# Patient Record
Sex: Male | Born: 1965 | Race: Black or African American | Hispanic: No | Marital: Single | Smoking: Former smoker
Health system: Southern US, Community
[De-identification: ages and names within clinical notes are randomized; demographics above are authoritative.]

## PROBLEM LIST (undated history)

## (undated) DIAGNOSIS — K5010 Crohn's disease of large intestine without complications: Secondary | ICD-10-CM

## (undated) HISTORY — PX: COLON SURGERY: SHX602

---

## 2014-11-30 ENCOUNTER — Ambulatory Visit
Admission: EM | Admit: 2014-11-30 | Discharge: 2014-11-30 | Disposition: A | Discharge status: Home / Self Care | Payer: BLUE CROSS/BLUE SHIELD | Attending: Family Medicine | Admitting: Family Medicine

## 2014-11-30 ENCOUNTER — Ambulatory Visit: Payer: BLUE CROSS/BLUE SHIELD

## 2014-11-30 ENCOUNTER — Encounter: Payer: Self-pay | Admitting: Emergency Medicine

## 2014-11-30 DIAGNOSIS — J189 Pneumonia, unspecified organism: Secondary | ICD-10-CM | POA: Diagnosis not present

## 2014-11-30 DIAGNOSIS — J181 Lobar pneumonia, unspecified organism: Principal | ICD-10-CM

## 2014-11-30 LAB — COMPREHENSIVE METABOLIC PANEL
ALBUMIN: 3.5 g/dL (ref 3.5–5.0)
ALT: 63 U/L (ref 17–63)
AST: 37 U/L (ref 15–41)
Alkaline Phosphatase: 86 U/L (ref 38–126)
Anion gap: 12 (ref 5–15)
BILIRUBIN TOTAL: 0.8 mg/dL (ref 0.3–1.2)
BUN: 16 mg/dL (ref 6–20)
CO2: 24 mmol/L (ref 22–32)
CREATININE: 1.29 mg/dL — AB (ref 0.61–1.24)
Calcium: 8.5 mg/dL — ABNORMAL LOW (ref 8.9–10.3)
Chloride: 98 mmol/L — ABNORMAL LOW (ref 101–111)
GFR calc Af Amer: >60 mL/min (ref >60)
GLUCOSE: 110 mg/dL — AB (ref 65–99)
Potassium: 3.8 mmol/L (ref 3.5–5.1)
Sodium: 134 mmol/L — ABNORMAL LOW (ref 135–145)
TOTAL PROTEIN: 7.7 g/dL (ref 6.5–8.1)

## 2014-11-30 LAB — CBC WITH DIFFERENTIAL/PLATELET
BASOS ABS: 0.1 10*3/uL (ref 0–0.1)
Basophils Relative: 0 %
Eosinophils Absolute: 0 10*3/uL (ref 0–0.7)
Eosinophils Relative: 0 %
HEMATOCRIT: 39.8 % — AB (ref 40.0–52.0)
HEMOGLOBIN: 13.7 g/dL (ref 13.0–18.0)
LYMPHS PCT: 15 %
Lymphs Abs: 2.1 10*3/uL (ref 1.0–3.6)
MCH: 29.7 pg (ref 26.0–34.0)
MCHC: 34.5 g/dL (ref 32.0–36.0)
MCV: 86.3 fL (ref 80.0–100.0)
Monocytes Absolute: 1.7 10*3/uL — ABNORMAL HIGH (ref 0.2–1.0)
Monocytes Relative: 12 %
NEUTROS ABS: 10.3 10*3/uL — AB (ref 1.4–6.5)
NEUTROS PCT: 73 %
Platelets: 219 10*3/uL (ref 150–440)
RBC: 4.62 MIL/uL (ref 4.40–5.90)
RDW: 12.9 % (ref 11.5–14.5)
WBC: 14.1 10*3/uL — AB (ref 3.8–10.6)

## 2014-11-30 MED ORDER — LEVOFLOXACIN 750 MG PO TABS: 750.0000 mg | ORAL_TABLET | Freq: Every day | ORAL | Status: DC

## 2014-11-30 MED ORDER — ACETAMINOPHEN 500 MG PO TABS
1000.0000 mg | ORAL_TABLET | Freq: Once | ORAL | Status: AC
  Administered 2014-11-30: 1000 mg via ORAL

## 2014-11-30 MED ORDER — LIDOCAINE HCL (PF) 1 % IJ SOLN
2.0000 mL | Freq: Once | INTRAMUSCULAR | Status: AC
  Administered 2014-11-30: 2 mL

## 2014-11-30 MED ORDER — IBUPROFEN 100 MG/5ML PO SUSP: 800.0000 mg | Freq: Once | ORAL | Status: DC

## 2014-11-30 MED ORDER — CEFTRIAXONE SODIUM 1 G IJ SOLR
1.0000 g | Freq: Once | INTRAMUSCULAR | Status: AC
  Administered 2014-11-30: 1 g via INTRAMUSCULAR

## 2014-11-30 MED ORDER — IBUPROFEN 800 MG PO TABS
800.0000 mg | ORAL_TABLET | Freq: Once | ORAL | Status: AC
  Administered 2014-11-30: 800 mg via ORAL

## 2014-11-30 NOTE — ED Notes (Signed)
Patient chest pain that started Saturday.  Patient denies SOB.

## 2014-11-30 NOTE — ED Provider Notes (Signed)
CSN: [id number]     Arrival date & time 11/30/14  1508 History   First MD Initiated Contact with Patient 11/30/14 1547     Chief Complaint  Patient presents with  . Chest Pain   (Consider location/radiation/quality/duration/timing/severity/associated sxs/prior Treatment) HPI Comments: 49 yo male with a 3 days h/o chest pains, associated with chills and fevers.  States pain is worse when laying on the left side. Denies any shortness of breath. Has a h/o Crohn's disease and receives Remicade infusions about every 8 weeks; last one was about 1 month ago.   The history is provided by the patient.    History reviewed. No pertinent past medical history. Past Surgical History  Procedure Laterality Date  . Colon surgery     History reviewed. No pertinent family history. Social History  Substance Use Topics  . Smoking status: Former Games developer  . Smokeless tobacco: None  . Alcohol Use: No    Review of Systems  Allergies  Review of patient's allergies indicates no known allergies.  Home Medications   Prior to Admission medications   Medication Sig Start Date End Date Taking? Authorizing Provider  inFLIXimab (REMICADE) 100 MG injection Inject 1 mg into the vein every 8 (eight) weeks.   Yes Historical Provider, MD  levofloxacin (LEVAQUIN) 750 MG tablet Take 1 tablet (750 mg total) by mouth daily. 11/30/14   Payton Mccallum, MD   Meds Ordered and Administered this Visit   Medications  acetaminophen (TYLENOL) tablet 1,000 mg (1,000 mg Oral Given 11/30/14 1700)  ibuprofen (ADVIL,MOTRIN) tablet 800 mg (800 mg Oral Given 11/30/14 1530)  cefTRIAXone (ROCEPHIN) injection 1 g (1 g Intramuscular Given 11/30/14 1718)  lidocaine (PF) (XYLOCAINE) 1 % injection 2 mL (2 mLs Other Given 11/30/14 1718)    BP 117/68 mmHg  Pulse 101  Temp(Src) 99.9 F (37.7 C) (Oral)  Resp 17  Ht  (1.803 m)  Wt 187 lb (84.823 kg)  BMI 26.09 kg/m2  SpO2 100% No data found.   Physical Exam  Constitutional: He  appears well-developed and well-nourished. No distress.  HENT:  Head: Normocephalic and atraumatic.  Right Ear: Tympanic membrane, external ear and ear canal normal.  Left Ear: Tympanic membrane, external ear and ear canal normal.  Nose: Nose normal.  Mouth/Throat: Uvula is midline, oropharynx is clear and moist and mucous membranes are normal. No oropharyngeal exudate or tonsillar abscesses.  Eyes: Conjunctivae and EOM are normal. Pupils are equal, round, and reactive to light. Right eye exhibits no discharge. Left eye exhibits no discharge. No scleral icterus.  Neck: Normal range of motion. Neck supple. No tracheal deviation present. No thyromegaly present.  Cardiovascular: Normal rate, regular rhythm and normal heart sounds.   Pulmonary/Chest: Effort normal and breath sounds normal. No stridor. No respiratory distress. He has no wheezes. He has no rales. He exhibits no tenderness.  Lymphadenopathy:    He has no cervical adenopathy.  Neurological: He is alert.  Skin: Skin is warm and dry. No rash noted. He is not diaphoretic.  Nursing note and vitals reviewed.   ED Course  Procedures (including critical care time)  Labs Review Labs Reviewed  CBC WITH DIFFERENTIAL/PLATELET - Abnormal; Notable for the following:    WBC 14.1 (*)    HCT 39.8 (*)    Neutro Abs 10.3 (*)    Monocytes Absolute 1.7 (*)    All other components within normal limits  COMPREHENSIVE METABOLIC PANEL - Abnormal; Notable for the following:    Sodium 134 (*)  Chloride 98 (*)    Glucose, Bld 110 (*)    Creatinine, Ser 1.29 (*)    Calcium 8.5 (*)    All other components within normal limits    Imaging Review Dg Chest 2 View  11/30/2014   CLINICAL DATA:  49 year old male with fever, left chest pain and body aches for 3 days.  EXAM: CHEST  2 VIEW  COMPARISON:  None.  FINDINGS: Left perihilar/superior segment left lower lobe airspace opacity is compatible with pneumonia.  The cardiomediastinal silhouette is  otherwise unremarkable.  There is no evidence of pulmonary edema, suspicious pulmonary nodule/mass, pleural effusion, or pneumothorax.  No acute bony abnormalities are identified.  IMPRESSION: Left perihilar/ superior segment left lower lobe airspace opacity - likely representing pneumonia given history. Radiographic follow-up to resolution is recommended.   Electronically Signed   By: Harmon Pier M.D.   On: 11/30/2014 16:30     Visual Acuity Review  Right Eye Distance:   Left Eye Distance:   Bilateral Distance:    Right Eye Near:   Left Eye Near:    Bilateral Near:         MDM   1. Left lower lobe pneumonia    Discharge Medication List as of 11/30/2014  5:15 PM    START taking these medications   Details  levofloxacin (LEVAQUIN) 750 MG tablet Take 1 tablet (750 mg total) by mouth daily., Starting 11/30/2014, Until Discontinued, Normal      Plan: 1. Test/x-ray results and diagnosis reviewed with patient 2. rx as per orders; risks, benefits, potential side effects reviewed with patient 3. Patient was given Rocephin 1gm IM x1 in clinic, as well as ibuprofen and tylenol po as per orders  4. Recommend supportive treatment with increased fluids, otc analgesics 5. Follow up with PCP in 3-4 days   6.  F/u here or ED prn if symptoms worsen or don't improve   Payton Mccallum, MD 11/30/14 1858

## 2014-11-30 NOTE — Discharge Instructions (Signed)

## 2017-03-15 ENCOUNTER — Encounter: Payer: Self-pay | Admitting: *Deleted

## 2017-03-15 ENCOUNTER — Ambulatory Visit
Admission: EM | Admit: 2017-03-15 | Discharge: 2017-03-15 | Disposition: A | Discharge status: Home / Self Care | Payer: BLUE CROSS/BLUE SHIELD | Attending: Family Medicine | Admitting: Family Medicine

## 2017-03-15 DIAGNOSIS — K047 Periapical abscess without sinus: Secondary | ICD-10-CM

## 2017-03-15 HISTORY — DX: Crohn's disease of large intestine without complications: K50.10

## 2017-03-15 MED ORDER — AMOXICILLIN-POT CLAVULANATE 875-125 MG PO TABS: 1.0000 | ORAL_TABLET | Freq: Two times a day (BID) | ORAL | 0 refills | Status: DC

## 2017-03-15 NOTE — ED Triage Notes (Signed)
Awoke Tuesday with right sided facial edema. Contacted his dentist who rx Amoxicillin and Ibuprofen. Edema is worse today.

## 2017-03-15 NOTE — ED Provider Notes (Addendum)
[facility]    CSN: [id number] Arrival date & time: 03/15/17  1051     History   Chief Complaint Chief Complaint  Patient presents with  . Facial Swelling    HPI Mark Woods is a 51 y.o. male.   HPI  Is a 51 year old male that states he awoke on Tuesday 2 days prior to his visit with right sided facial edema.  He has had pain over a area on his upper palate behind the incisors on the right.  This morning he awoke and his swelling was much worse and his eye was actually closed due to swelling.  Has gotten much better since he has been up for a while.  He contacted his dentist who called in a prescription for amoxicillin and ibuprofen.  He is only taken 1 dose of the amoxicillin.  He will follow up with his dentist next week.          Past Medical History:  Diagnosis Date  . Crohn's colitis (HCC)     There are no active problems to display for this patient.   Past Surgical History:  Procedure Laterality Date  . COLON SURGERY         Home Medications    Prior to Admission medications   Medication Sig Start Date End Date Taking? Authorizing Provider  inFLIXimab (REMICADE) 100 MG injection Inject 1 mg into the vein every 8 (eight) weeks.   Yes [provider]  amoxicillin-clavulanate (AUGMENTIN) 875-125 MG tablet Take 1 tablet by mouth every 12 (twelve) hours. 03/15/17   Lutricia Feiloemer, Taiden Raybourn P, PA-C    Family History Family History  Problem Relation Age of Onset  . Healthy Mother   . Healthy Father     Social History Social History   Tobacco Use  . Smoking status: Former Games developermoker  . Smokeless tobacco: Never Used  Substance Use Topics  . Alcohol use: No  . Drug use: No     Allergies   Patient has no known allergies.   Review of Systems Review of Systems  Constitutional: Positive for activity change. Negative for chills, fatigue and fever.  HENT: Positive for dental problem and facial swelling.   All other systems reviewed and  are negative.    Physical Exam Triage Vital Signs ED Triage Vitals  Enc Vitals Group     BP 03/15/17 1111 (!) 147/78     Pulse Rate 03/15/17 1111 67     Resp 03/15/17 1111 16     Temp 03/15/17 1111 98.8 F (37.1 C)     Temp Source 03/15/17 1111 Oral     SpO2 03/15/17 1111 100 %     Weight 03/15/17 1113 186 lb (84.4 kg)     Height 03/15/17 1113 5\' 11"  (1.803 m)     Head Circumference --      Peak Flow --      Pain Score --      Pain Loc --      Pain Edu? --      Excl. in GC? --    No data found.  Updated Vital Signs BP (!) 147/78 (BP Location: Left Arm)   Pulse 67   Temp 98.8 F (37.1 C) (Oral)   Resp 16   Ht 5\' 11"  (1.803 m)   Wt 186 lb (84.4 kg)   SpO2 100%   BMI 25.94 kg/m   Visual Acuity Right Eye Distance:   Left Eye Distance:   Bilateral Distance:  Right Eye Near:   Left Eye Near:    Bilateral Near:     Physical Exam  Constitutional: He is oriented to person, place, and time. He appears well-developed and well-nourished. No distress.  HENT:  Head: Normocephalic.  Examination shows the patient's right side of his face to be swollen in comparison to the left.  End of the oropharynx that shows a moderately enlarged swelling on the upper palate just behind the incisor.  Referred to photographs for detail.  Not appear to be any apical lymphadenopathy although there is a fullness over the right cheek at the mandibular border.  The swelling on the hard palate is firm but not fluctuant.  It is tender to palpation  Eyes: EOM are normal. Pupils are equal, round, and reactive to light.  Neck: Normal range of motion.  Musculoskeletal: Normal range of motion.  Lymphadenopathy:    He has no cervical adenopathy.  Neurological: He is alert and oriented to person, place, and time.  Skin: Skin is warm and dry. He is not diaphoretic.  Psychiatric: He has a normal mood and affect. His behavior is normal. Judgment and thought content normal.  Nursing note and vitals  reviewed.        UC Treatments / Results  Labs (all labs ordered are listed, but only abnormal results are displayed) Labs Reviewed - No data to display  EKG  EKG Interpretation None       Radiology No results found.  Procedures Procedures (including critical care time)  Medications Ordered in UC Medications - No data to display   Initial Impression / Assessment and Plan / UC Course  I have reviewed the triage vital signs and the nursing notes.  Pertinent labs & imaging results that were available during my care of the patient were reviewed by me and considered in my medical decision making (see chart for details).     Plan: 1. Test/x-ray results and diagnosis reviewed with patient 2. rx as per orders; risks, benefits, potential side effects reviewed with patient 3. Recommend supportive treatment with ice packs to the right face for swelling and discomfort.  Continue with the ibuprofen.  For better coverage of switched him from amoxicillin to Augmentin.  Recommend he follow-up with his dentist next week. 4. F/u prn if symptoms worsen or don't improve   Final Clinical Impressions(s) / UC Diagnoses   Final diagnoses:  Dental abscess    ED Discharge Orders        Ordered    amoxicillin-clavulanate (AUGMENTIN) 875-125 MG tablet  Every 12 hours     03/15/17 1210       Controlled Substance Prescriptions [place] Controlled Substance Registry consulted? Not Applicable   Lutricia FeilRoemer, Zhania Shaheen P, PA-C 03/15/17 1224    Lutricia Feiloemer, Dominic Mahaney P, PA-C 03/15/17 1226

## 2017-03-15 NOTE — Discharge Instructions (Signed)
Follow-up with your dentist next week.  Apply ice to your face 20 minutes out of every 2 hours 4-5 times daily for swelling and pain

## 2017-04-16 IMAGING — CR DG CHEST 2V
2 series · 3 of 3 positions shown · non-contrast
Comparison: None.

CLINICAL DATA: 48-year-old male with fever, left chest pain and
body aches for 3 days.

EXAM:
CHEST  2 VIEW

[chest pa]
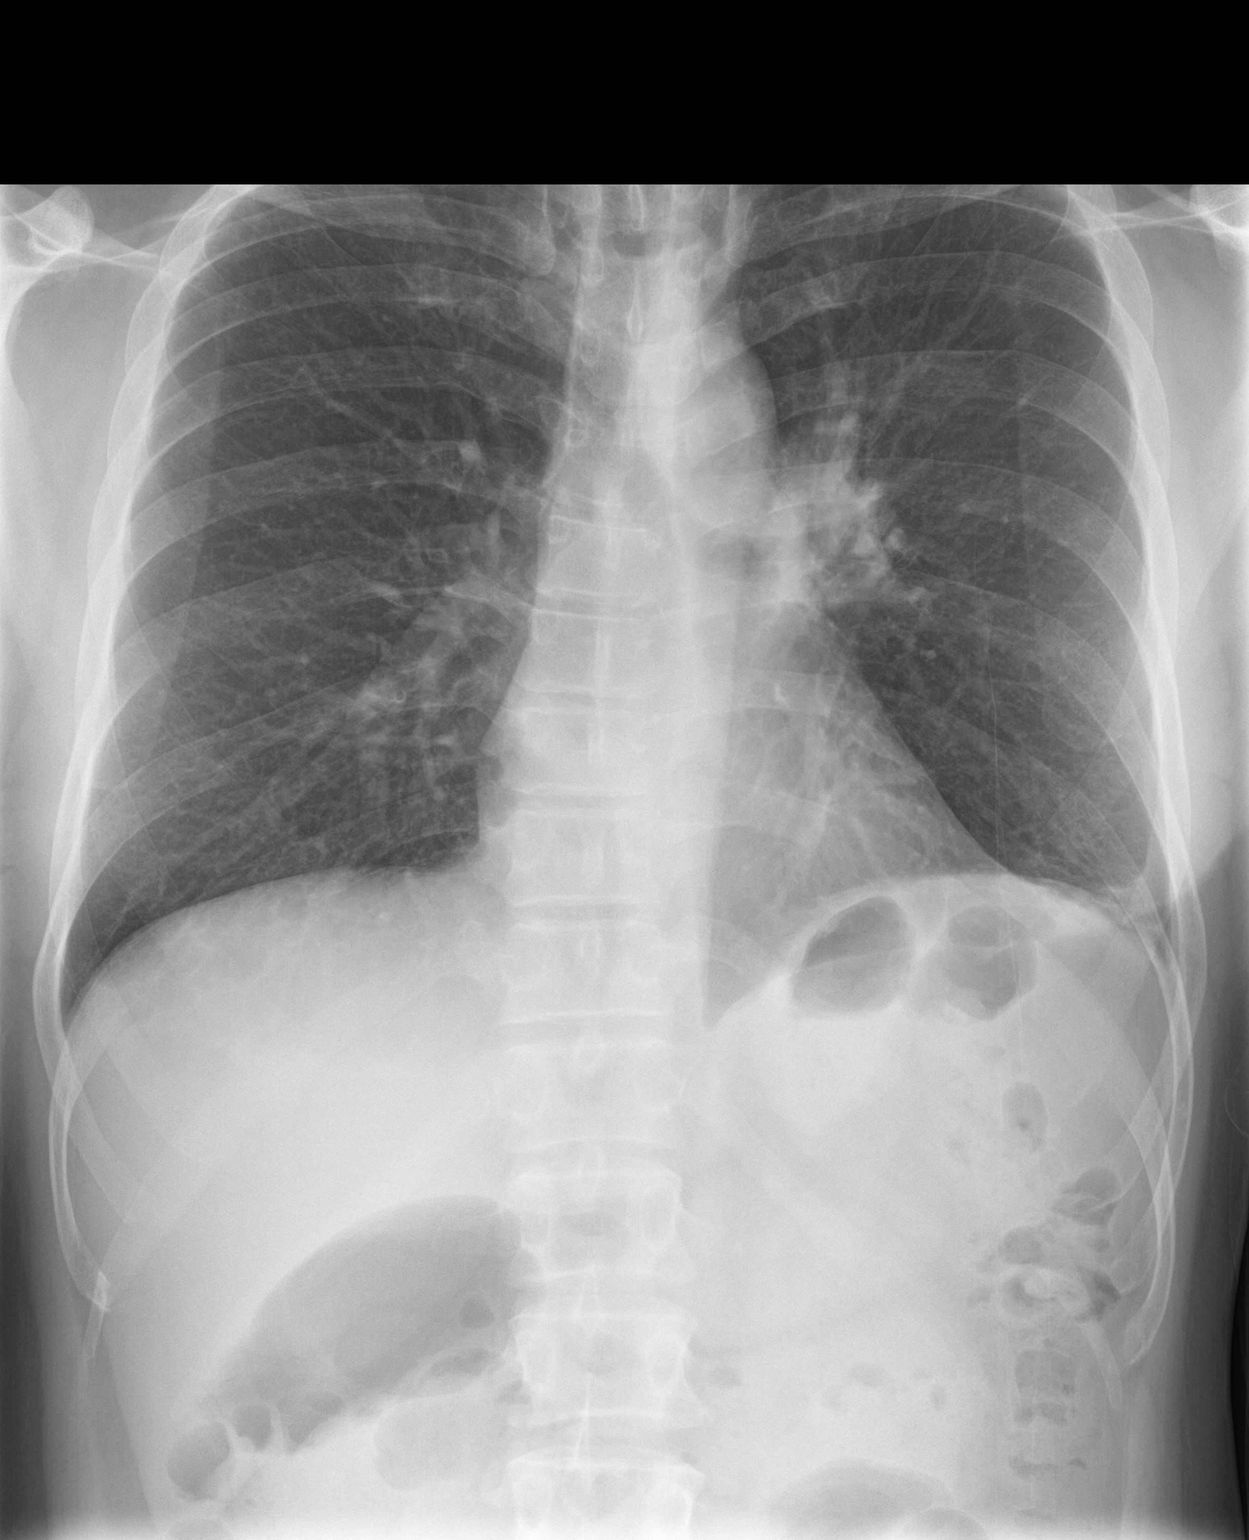

[Series 2: chest lat · 0.14mm/px · 2 of 2 slices shown]
[im 1/2]
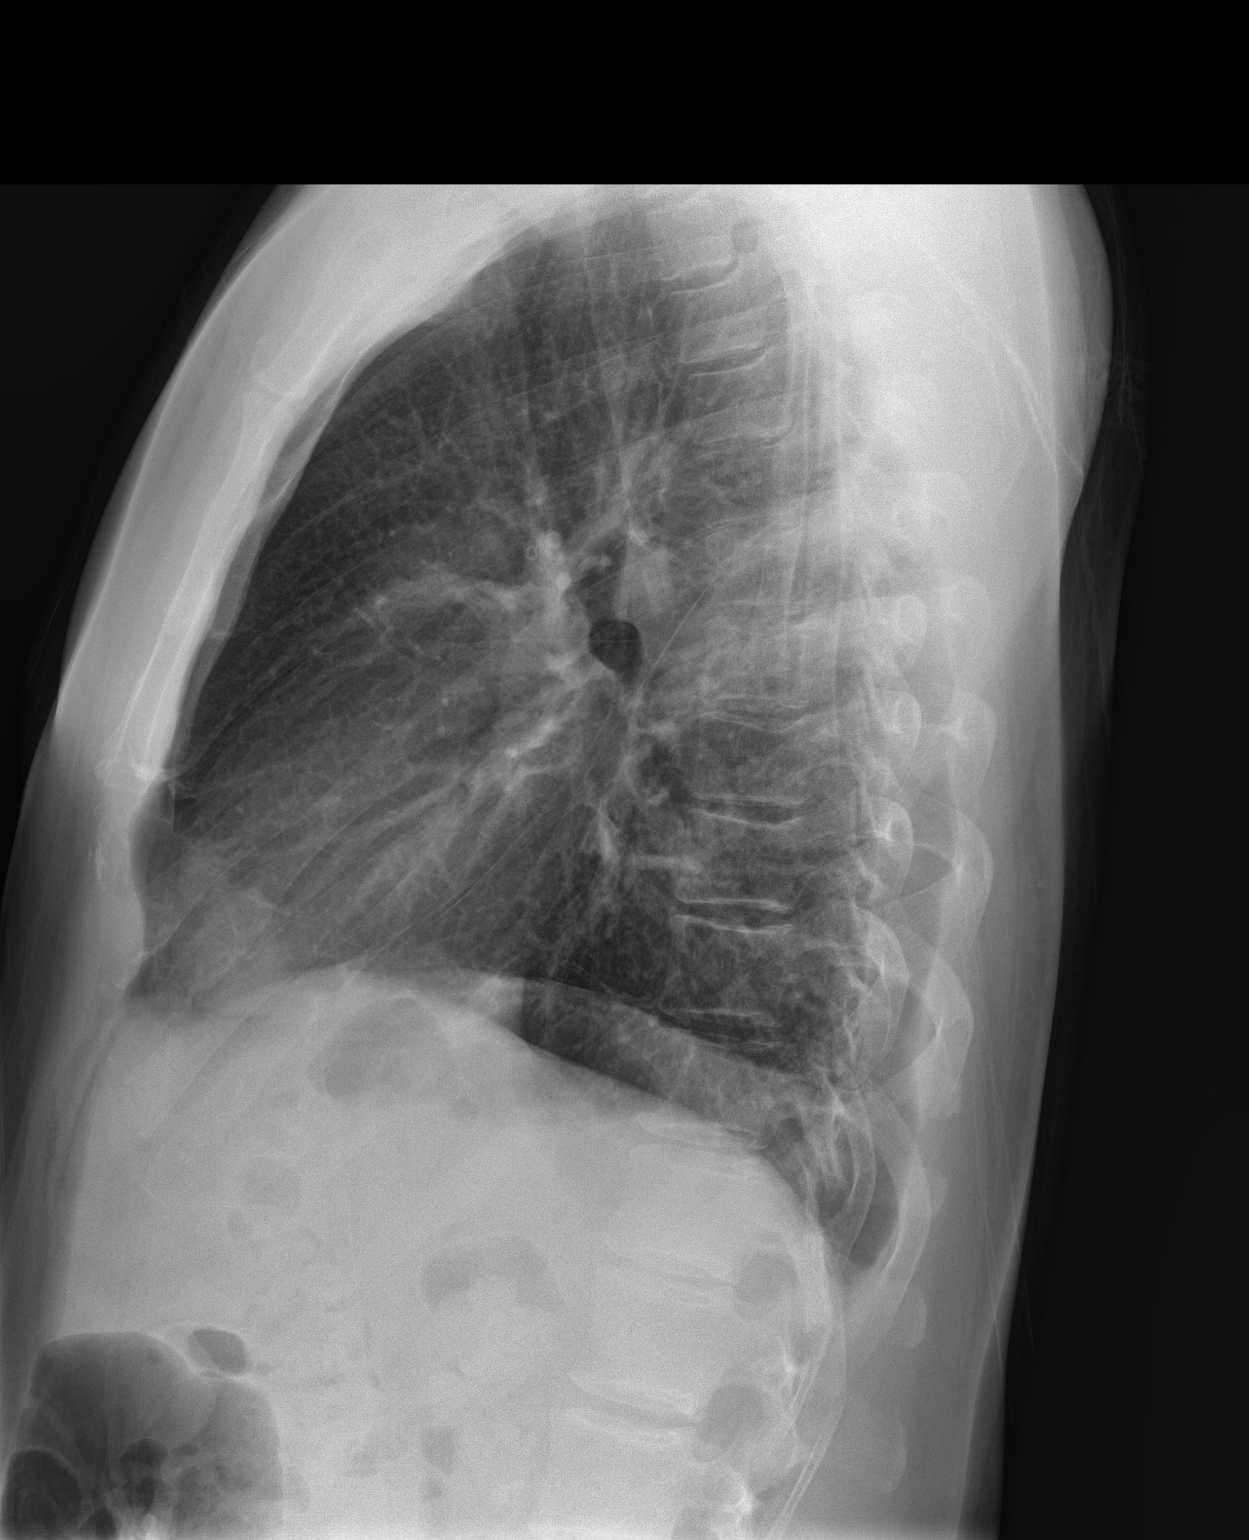
[im 2/2]
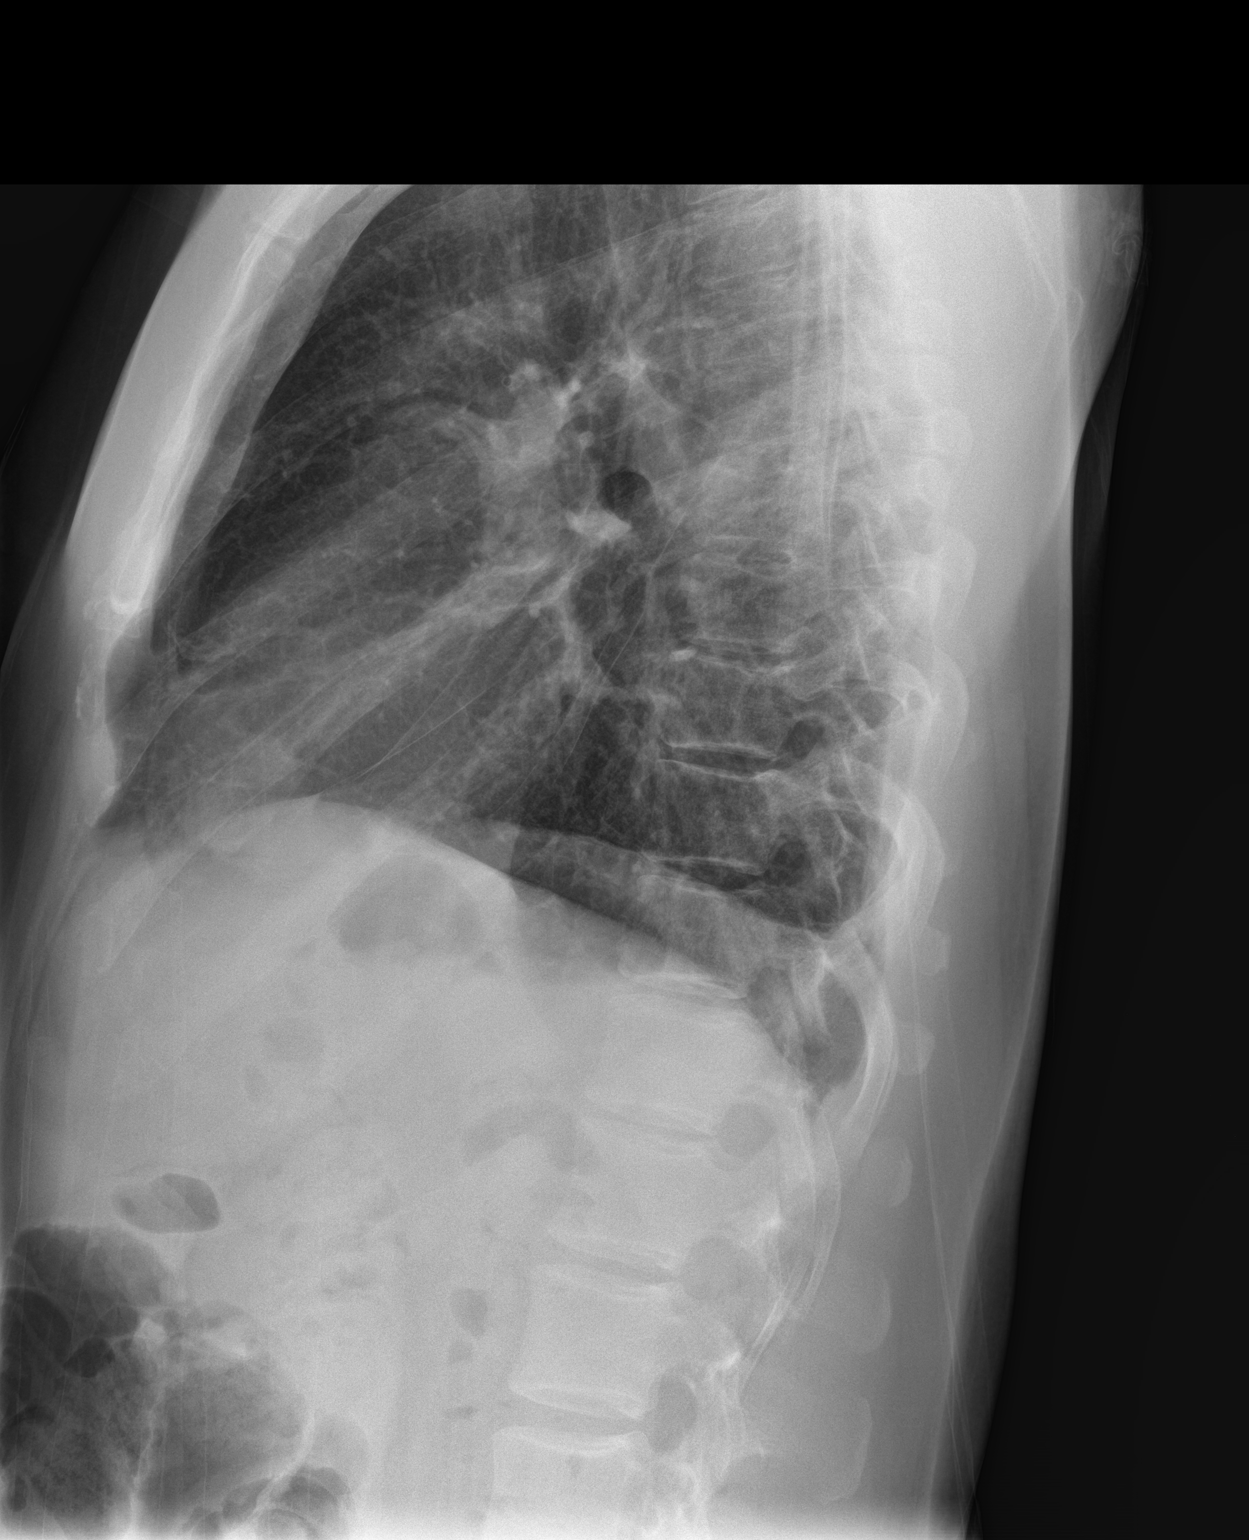

[3 of 3 positions shown; findings below may reference images not displayed]

FINDINGS: Left perihilar/superior segment left lower lobe airspace opacity is
compatible with pneumonia.

The cardiomediastinal silhouette is otherwise unremarkable.

There is no evidence of pulmonary edema, suspicious pulmonary
nodule/mass, pleural effusion, or pneumothorax.

No acute bony abnormalities are identified.
IMPRESSION: Left perihilar/ superior segment left lower lobe airspace opacity -
likely representing pneumonia given history. Radiographic follow-up
to resolution is recommended.

## 2017-10-04 ENCOUNTER — Ambulatory Visit
Admission: EM | Admit: 2017-10-04 | Discharge: 2017-10-04 | Disposition: A | Discharge status: Home / Self Care | Payer: Managed Care, Other (non HMO) | Attending: Family Medicine | Admitting: Family Medicine

## 2017-10-04 ENCOUNTER — Other Ambulatory Visit: Payer: Self-pay

## 2017-10-04 DIAGNOSIS — B029 Zoster without complications: Secondary | ICD-10-CM | POA: Diagnosis not present

## 2017-10-04 MED ORDER — VALACYCLOVIR HCL 1 G PO TABS: 1000.0000 mg | ORAL_TABLET | Freq: Three times a day (TID) | ORAL | 0 refills | Status: AC

## 2017-10-04 MED ORDER — TRAMADOL HCL 50 MG PO TABS: 50.0000 mg | ORAL_TABLET | Freq: Three times a day (TID) | ORAL | 0 refills | Status: AC | PRN

## 2017-10-04 MED ORDER — NAPROXEN 500 MG PO TABS: 500.0000 mg | ORAL_TABLET | Freq: Two times a day (BID) | ORAL | 0 refills | Status: AC | PRN

## 2017-10-04 NOTE — Discharge Instructions (Signed)
Medications as directed.  Take care  Dr. Florida Nolton  

## 2017-10-04 NOTE — ED Provider Notes (Signed)
[facility]   CSN: [id number] Arrival date & time: 10/04/17  1001   History   Chief Complaint Chief Complaint  Patient presents with  . Rash   HPI  52 year old male presents with rash.  Patient reports that he developed a rash on Monday.  Has worsened.  Rash is located on the left chest below the nipple and extends posteriorly to the back.  Associated pain.  Rash is vesicular.  No medications or interventions tried.  Moderate in severity.  Exacerbated by activity.  No relieving factors.  No other associated symptoms.  No other complaints.  Past Medical History:  Diagnosis Date  . Crohn's colitis [facility])    Past Surgical History:  Procedure Laterality Date  . COLON SURGERY     Home Medications    Prior to Admission medications   Medication Sig Start Date End Date Taking? Authorizing Provider  inFLIXimab (REMICADE) 100 MG injection Inject 1 mg into the vein every 8 (eight) weeks.   Yes [provider]  amoxicillin-clavulanate (AUGMENTIN) 875-125 MG tablet Take 1 tablet by mouth every 12 (twelve) hours. 03/15/17   Lutricia Feil, PA-C  naproxen (NAPROSYN) 500 MG tablet Take 1 tablet (500 mg total) by mouth 2 (two) times daily as needed. 10/04/17   Tommie Sams, DO  traMADol (ULTRAM) 50 MG tablet Take 1 tablet (50 mg total) by mouth every 8 (eight) hours as needed. 10/04/17   Tommie Sams, DO  valACYclovir (VALTREX) 1000 MG tablet Take 1 tablet (1,000 mg total) by mouth 3 (three) times daily. 10/04/17   Tommie Sams, DO    Family History Family History  Problem Relation Age of Onset  . Healthy Mother   . Healthy Father     Social History Social History   Tobacco Use  . Smoking status: Former Games developer  . Smokeless tobacco: Never Used  Substance Use Topics  . Alcohol use: No  . Drug use: No     Allergies   Patient has no known allergies.   Review of Systems Review of Systems  Constitutional: Negative.   Skin: Positive for rash.   Painful rash.   Physical Exam Triage Vital Signs ED Triage Vitals  Enc Vitals Group     BP 10/04/17 1012 (!) 162/92     Pulse Rate 10/04/17 1012 74     Resp 10/04/17 1012 18     Temp 10/04/17 1012 98.1 F (36.7 C)     Temp Source 10/04/17 1012 Oral     SpO2 10/04/17 1012 100 %     Weight 10/04/17 1011 187 lb (84.8 kg)     Height 10/04/17 1011 5\' 11"  (1.803 m)     Head Circumference --      Peak Flow --      Pain Score 10/04/17 1011 4     Pain Loc --      Pain Edu? --      Excl. in GC? --    Updated Vital Signs BP (!) 162/92 (BP Location: Right Arm)   Pulse 74   Temp 98.1 F (36.7 C) (Oral)   Resp 18   Ht 5\' 11"  (1.803 m)   Wt 187 lb (84.8 kg)   SpO2 100%   BMI 26.08 kg/m   Visual Acuity Right Eye Distance:   Left Eye Distance:   Bilateral Distance:    Right Eye Near:   Left Eye Near:    Bilateral Near:     Physical Exam  Constitutional: He is oriented to person, place, and time. He appears well-developed. No distress.  HENT:  Head: Normocephalic and atraumatic.  Cardiovascular: Normal rate and regular rhythm.  Pulmonary/Chest: Effort normal. No respiratory distress.  Neurological: He is alert and oriented to person, place, and time.  Skin:  Vesicular rash noted in dermatomal distribution (left anterior chest and extending to the back).  Psychiatric: He has a normal mood and affect. His behavior is normal.  Nursing note and vitals reviewed.  UC Treatments / Results  Labs (all labs ordered are listed, but only abnormal results are displayed) Labs Reviewed - No data to display  EKG None  Radiology No results found.  Procedures Procedures (including critical care time)  Medications Ordered in UC Medications - No data to display  Initial Impression / Assessment and Plan / UC Course  I have reviewed the triage vital signs and the nursing notes.  Pertinent labs & imaging results that were available during my care of the patient were reviewed by me  and considered in my medical decision making (see chart for details).    52 year old male presents with herpes zoster.  Treating with Valtrex.  Naproxen twice daily as needed for pain.  Tramadol if needed as well.  Final Clinical Impressions(s) / UC Diagnoses   Final diagnoses:  Herpes zoster without complication     Discharge Instructions     Medications as directed.  Take care  Dr. Adriana Simasook    ED Prescriptions    Medication Sig Dispense Auth. Provider   naproxen (NAPROSYN) 500 MG tablet Take 1 tablet (500 mg total) by mouth 2 (two) times daily as needed. 30 tablet Numan Zylstra G, DO   valACYclovir (VALTREX) 1000 MG tablet Take 1 tablet (1,000 mg total) by mouth 3 (three) times daily. 21 tablet Cozette Braggs G, DO   traMADol (ULTRAM) 50 MG tablet Take 1 tablet (50 mg total) by mouth every 8 (eight) hours as needed. 15 tablet Tommie Samsook, Felicitas Sine G, DO     Controlled Substance Prescriptions [place] Controlled Substance Registry consulted? Not Applicable   Tommie SamsCook, Solomia Harrell G, DO 10/04/17 1048

## 2017-10-04 NOTE — ED Triage Notes (Signed)
Patient complains of rash under left breast that wraps around to midline in back. Patient states that rash is throbbing x 1 day.

## 2020-10-10 ENCOUNTER — Other Ambulatory Visit: Payer: Self-pay

## 2020-10-10 ENCOUNTER — Ambulatory Visit
Admission: EM | Admit: 2020-10-10 | Discharge: 2020-10-10 | Disposition: A | Discharge status: Home / Self Care | Payer: Managed Care, Other (non HMO) | Attending: Internal Medicine | Admitting: Internal Medicine

## 2020-10-10 ENCOUNTER — Encounter: Payer: Self-pay | Admitting: Emergency Medicine

## 2020-10-10 DIAGNOSIS — T161XXA Foreign body in right ear, initial encounter: Secondary | ICD-10-CM | POA: Diagnosis not present

## 2020-10-10 NOTE — ED Provider Notes (Signed)
[facility]    CSN: [id number] Arrival date & time: 10/10/20  4401      History   Chief Complaint Chief Complaint  Patient presents with   Foreign Body in Ear    right    HPI Mark Woods is a 55 y.o. male who presents with a bug in his ear. He was doing yard work today and felt something went into his R ear. He denies pain    Past Medical History:  Diagnosis Date   Crohn's colitis (HCC)     There are no problems to display for this patient.   Past Surgical History:  Procedure Laterality Date   COLON SURGERY         Home Medications    Prior to Admission medications   Medication Sig Start Date End Date Taking? Authorizing Provider  inFLIXimab (REMICADE) 100 MG injection Inject 1 mg into the vein every 8 (eight) weeks.   Yes [provider]  amoxicillin-clavulanate (AUGMENTIN) 875-125 MG tablet Take 1 tablet by mouth every 12 (twelve) hours. 03/15/17   Lutricia Feil, PA-C  naproxen (NAPROSYN) 500 MG tablet Take 1 tablet (500 mg total) by mouth 2 (two) times daily as needed. 10/04/17   Tommie Sams, DO  traMADol (ULTRAM) 50 MG tablet Take 1 tablet (50 mg total) by mouth every 8 (eight) hours as needed. 10/04/17   Tommie Sams, DO  valACYclovir (VALTREX) 1000 MG tablet Take 1 tablet (1,000 mg total) by mouth 3 (three) times daily. 10/04/17   Tommie Sams, DO    Family History Family History  Problem Relation Age of Onset   Healthy Mother    Healthy Father     Social History Social History   Tobacco Use   Smoking status: Former   Smokeless tobacco: Never  Building services engineer Use: Never used  Substance Use Topics   Alcohol use: No   Drug use: No     Allergies   Patient has no known allergies.   Review of Systems Review of Systems FU R ear, denies ear pain.   Physical Exam Triage Vital Signs ED Triage Vitals  Enc Vitals Group     BP 10/10/20 1120 (!) 168/101     Pulse Rate 10/10/20 1120 (!) 51     Resp 10/10/20 1120  16     Temp 10/10/20 1120 97.8 F (36.6 C)     Temp Source 10/10/20 1120 Oral     SpO2 10/10/20 1120 98 %     Weight 10/10/20 1118 186 lb 15.2 oz (84.8 kg)     Height 10/10/20 1118 5\' 11"  (1.803 m)     Head Circumference --      Peak Flow --      Pain Score 10/10/20 1118 0     Pain Loc --      Pain Edu? --      Excl. in GC? --    No data found.  Updated Vital Signs BP (!) 168/101 (BP Location: Left Arm)   Pulse (!) 51   Temp 97.8 F (36.6 C) (Oral)   Resp 16   Ht 5\' 11"  (1.803 m)   Wt 186 lb 15.2 oz (84.8 kg)   SpO2 98%   BMI 26.07 kg/m   Visual Acuity Right Eye Distance:   Left Eye Distance:   Bilateral Distance:    Right Eye Near:   Left Eye Near:    Bilateral Near:  Physical Exam Vitals and nursing note reviewed.  Constitutional:      Appearance: He is normal weight.  HENT:     Head: Normocephalic.     Right Ear: Tympanic membrane normal.     Left Ear: Tympanic membrane, ear canal and external ear normal.     Ears:     Comments: R upper canal with gray winged insect, at present time not moving. After lavage his ear exam is normal, with no FB seen Eyes:     General: No scleral icterus.    Conjunctiva/sclera: Conjunctivae normal.  Pulmonary:     Effort: Pulmonary effort is normal.  Musculoskeletal:        General: Normal range of motion.     Cervical back: Neck supple.  Neurological:     Mental Status: He is alert and oriented to person, place, and time.     Gait: Gait normal.  Psychiatric:        Mood and Affect: Mood normal.        Behavior: Behavior normal.        Thought Content: Thought content normal.        Judgment: Judgment normal.     UC Treatments / Results  Labs (all labs ordered are listed, but only abnormal results are displayed) Labs Reviewed - No data to display  EKG   Radiology No results found.  Procedures Procedures (including critical care time)  Medications Ordered in UC Medications - No data to  display  Initial Impression / Assessment and Plan / UC Course  I have reviewed the triage vital signs and the nursing notes. FB  R ear. Ear lavage done and the insect was fully flushed out.  May FU prn.      Final Clinical Impressions(s) / UC Diagnoses   Final diagnoses:  None   Discharge Instructions   None    ED Prescriptions   None    PDMP not reviewed this encounter.   Garey Ham, PA-C 10/10/20 1210

## 2020-10-10 NOTE — ED Triage Notes (Signed)
Patient states that he was out in the yard this morning and felt some thing fly into his right ear.  Patient denies any pain
